# Patient Record
Sex: Male | Born: 1980 | Race: White | Hispanic: No | Smoking: Current every day smoker
Health system: Southern US, Community
[De-identification: ages and names within clinical notes are randomized; demographics above are authoritative.]

## PROBLEM LIST (undated history)

## (undated) DIAGNOSIS — R51 Headache: Secondary | ICD-10-CM

## (undated) DIAGNOSIS — R519 Headache, unspecified: Secondary | ICD-10-CM

---

## 2002-03-31 ENCOUNTER — Emergency Department (HOSPITAL_COMMUNITY): Admission: EM | Admit: 2002-03-31 | Discharge: 2002-03-31 | Payer: Self-pay | Admitting: Internal Medicine

## 2002-06-04 ENCOUNTER — Emergency Department (HOSPITAL_COMMUNITY): Admission: EM | Admit: 2002-06-04 | Discharge: 2002-06-04 | Payer: Self-pay | Admitting: *Deleted

## 2002-07-26 ENCOUNTER — Emergency Department (HOSPITAL_COMMUNITY): Admission: EM | Admit: 2002-07-26 | Discharge: 2002-07-26 | Payer: Self-pay | Admitting: Emergency Medicine

## 2002-12-08 ENCOUNTER — Emergency Department (HOSPITAL_COMMUNITY): Admission: EM | Admit: 2002-12-08 | Discharge: 2002-12-08 | Payer: Self-pay | Admitting: Emergency Medicine

## 2003-04-14 ENCOUNTER — Emergency Department (HOSPITAL_COMMUNITY): Admission: EM | Admit: 2003-04-14 | Discharge: 2003-04-14 | Payer: Self-pay | Admitting: Emergency Medicine

## 2003-05-06 ENCOUNTER — Encounter: Payer: Self-pay | Admitting: Family Medicine

## 2003-05-06 ENCOUNTER — Ambulatory Visit (HOSPITAL_COMMUNITY): Admission: RE | Admit: 2003-05-06 | Discharge: 2003-05-06 | Payer: Self-pay | Admitting: Family Medicine

## 2003-08-08 ENCOUNTER — Emergency Department (HOSPITAL_COMMUNITY): Admission: EM | Admit: 2003-08-08 | Discharge: 2003-08-08 | Payer: Self-pay | Admitting: Emergency Medicine

## 2003-10-27 ENCOUNTER — Emergency Department (HOSPITAL_COMMUNITY): Admission: EM | Admit: 2003-10-27 | Discharge: 2003-10-27 | Payer: Self-pay | Admitting: Emergency Medicine

## 2003-12-22 ENCOUNTER — Emergency Department (HOSPITAL_COMMUNITY): Admission: EM | Admit: 2003-12-22 | Discharge: 2003-12-22 | Payer: Self-pay | Admitting: Emergency Medicine

## 2004-01-16 ENCOUNTER — Emergency Department (HOSPITAL_COMMUNITY): Admission: EM | Admit: 2004-01-16 | Discharge: 2004-01-16 | Payer: Self-pay | Admitting: Emergency Medicine

## 2004-03-01 ENCOUNTER — Emergency Department (HOSPITAL_COMMUNITY): Admission: EM | Admit: 2004-03-01 | Discharge: 2004-03-01 | Payer: Self-pay | Admitting: Emergency Medicine

## 2004-06-01 ENCOUNTER — Emergency Department (HOSPITAL_COMMUNITY): Admission: EM | Admit: 2004-06-01 | Discharge: 2004-06-01 | Payer: Self-pay | Admitting: Emergency Medicine

## 2004-11-16 ENCOUNTER — Emergency Department (HOSPITAL_COMMUNITY): Admission: EM | Admit: 2004-11-16 | Discharge: 2004-11-16 | Payer: Self-pay | Admitting: Emergency Medicine

## 2004-12-08 ENCOUNTER — Emergency Department (HOSPITAL_COMMUNITY): Admission: EM | Admit: 2004-12-08 | Discharge: 2004-12-08 | Payer: Self-pay | Admitting: Emergency Medicine

## 2006-02-07 ENCOUNTER — Emergency Department (HOSPITAL_COMMUNITY): Admission: EM | Admit: 2006-02-07 | Discharge: 2006-02-07 | Payer: Self-pay | Admitting: Emergency Medicine

## 2006-02-11 ENCOUNTER — Emergency Department (HOSPITAL_COMMUNITY): Admission: EM | Admit: 2006-02-11 | Discharge: 2006-02-11 | Payer: Self-pay | Admitting: Emergency Medicine

## 2006-06-06 ENCOUNTER — Emergency Department (HOSPITAL_COMMUNITY): Admission: EM | Admit: 2006-06-06 | Discharge: 2006-06-06 | Payer: Self-pay | Admitting: *Deleted

## 2006-06-20 ENCOUNTER — Emergency Department (HOSPITAL_COMMUNITY): Admission: EM | Admit: 2006-06-20 | Discharge: 2006-06-20 | Payer: Self-pay | Admitting: Emergency Medicine

## 2006-07-16 ENCOUNTER — Emergency Department (HOSPITAL_COMMUNITY): Admission: EM | Admit: 2006-07-16 | Discharge: 2006-07-16 | Payer: Self-pay | Admitting: Emergency Medicine

## 2006-08-09 ENCOUNTER — Emergency Department (HOSPITAL_COMMUNITY): Admission: EM | Admit: 2006-08-09 | Discharge: 2006-08-09 | Payer: Self-pay | Admitting: Emergency Medicine

## 2007-11-19 ENCOUNTER — Emergency Department (HOSPITAL_COMMUNITY): Admission: EM | Admit: 2007-11-19 | Discharge: 2007-11-19 | Payer: Self-pay | Admitting: Emergency Medicine

## 2007-12-16 ENCOUNTER — Emergency Department (HOSPITAL_COMMUNITY): Admission: EM | Admit: 2007-12-16 | Discharge: 2007-12-16 | Payer: Self-pay | Admitting: Emergency Medicine

## 2008-03-24 ENCOUNTER — Emergency Department (HOSPITAL_COMMUNITY): Admission: EM | Admit: 2008-03-24 | Discharge: 2008-03-24 | Payer: Self-pay | Admitting: Emergency Medicine

## 2008-04-22 ENCOUNTER — Emergency Department (HOSPITAL_COMMUNITY): Admission: EM | Admit: 2008-04-22 | Discharge: 2008-04-22 | Payer: Self-pay | Admitting: Emergency Medicine

## 2008-05-24 ENCOUNTER — Emergency Department (HOSPITAL_COMMUNITY): Admission: EM | Admit: 2008-05-24 | Discharge: 2008-05-24 | Payer: Self-pay | Admitting: Emergency Medicine

## 2008-06-01 ENCOUNTER — Emergency Department (HOSPITAL_COMMUNITY): Admission: EM | Admit: 2008-06-01 | Discharge: 2008-06-01 | Payer: Self-pay | Admitting: Emergency Medicine

## 2008-08-05 ENCOUNTER — Emergency Department (HOSPITAL_COMMUNITY): Admission: EM | Admit: 2008-08-05 | Discharge: 2008-08-05 | Payer: Self-pay | Admitting: Emergency Medicine

## 2008-08-28 ENCOUNTER — Emergency Department (HOSPITAL_COMMUNITY): Admission: EM | Admit: 2008-08-28 | Discharge: 2008-08-29 | Payer: Self-pay | Admitting: *Deleted

## 2008-09-19 ENCOUNTER — Emergency Department (HOSPITAL_COMMUNITY): Admission: EM | Admit: 2008-09-19 | Discharge: 2008-09-19 | Payer: Self-pay | Admitting: Emergency Medicine

## 2009-04-28 ENCOUNTER — Emergency Department (HOSPITAL_COMMUNITY): Admission: EM | Admit: 2009-04-28 | Discharge: 2009-04-28 | Payer: Self-pay | Admitting: Emergency Medicine

## 2009-06-08 ENCOUNTER — Emergency Department (HOSPITAL_COMMUNITY): Admission: EM | Admit: 2009-06-08 | Discharge: 2009-06-08 | Payer: Self-pay | Admitting: Emergency Medicine

## 2009-06-22 ENCOUNTER — Emergency Department (HOSPITAL_COMMUNITY): Admission: EM | Admit: 2009-06-22 | Discharge: 2009-06-22 | Payer: Self-pay | Admitting: Emergency Medicine

## 2009-08-23 ENCOUNTER — Emergency Department (HOSPITAL_COMMUNITY): Admission: EM | Admit: 2009-08-23 | Discharge: 2009-08-23 | Payer: Self-pay | Admitting: Emergency Medicine

## 2009-08-24 ENCOUNTER — Emergency Department (HOSPITAL_COMMUNITY): Admission: EM | Admit: 2009-08-24 | Discharge: 2009-08-24 | Payer: Self-pay | Admitting: Emergency Medicine

## 2009-10-21 ENCOUNTER — Emergency Department (HOSPITAL_COMMUNITY): Admission: EM | Admit: 2009-10-21 | Discharge: 2009-10-21 | Payer: Self-pay | Admitting: Emergency Medicine

## 2009-10-22 ENCOUNTER — Emergency Department (HOSPITAL_COMMUNITY): Admission: EM | Admit: 2009-10-22 | Discharge: 2009-10-22 | Payer: Self-pay | Admitting: Emergency Medicine

## 2009-10-25 ENCOUNTER — Emergency Department (HOSPITAL_COMMUNITY): Admission: EM | Admit: 2009-10-25 | Discharge: 2009-10-25 | Payer: Self-pay | Admitting: Emergency Medicine

## 2010-01-01 ENCOUNTER — Emergency Department (HOSPITAL_COMMUNITY): Admission: EM | Admit: 2010-01-01 | Discharge: 2010-01-01 | Payer: Self-pay | Admitting: Emergency Medicine

## 2010-05-09 ENCOUNTER — Emergency Department (HOSPITAL_COMMUNITY): Admission: EM | Admit: 2010-05-09 | Discharge: 2010-05-09 | Payer: Self-pay | Admitting: Emergency Medicine

## 2010-07-24 IMAGING — CT CT ABD-PELV W/O CM
3 of 4 series · 9 of 46 positions shown, 16 images · non-contrast
Comparison: Abdominal ultrasound 06/08/2009.

CLINICAL DATA: Left flank pain.  Question ureteral calculus.

CT ABDOMEN AND PELVIS WITHOUT CONTRAST
TECHNIQUE: Multidetector CT imaging of the abdomen and pelvis was
performed following the standard protocol without intravenous
contrast.

[Series 3: lung 5.0 b60f · axial · 0.66mm/px · z∈[-168,-82]mm · 5 of 27 slices shown, 10 images]
[im 5/27  soft-tissue]
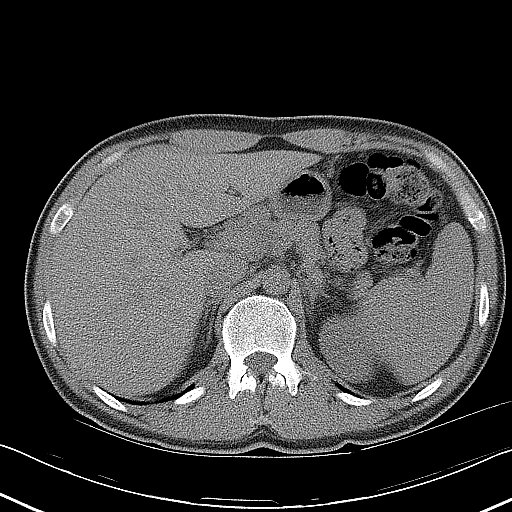
[im 5/27  bone]
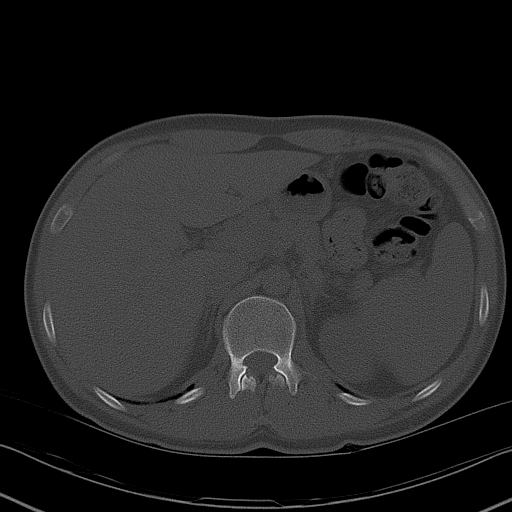
[im 9/27  soft-tissue]
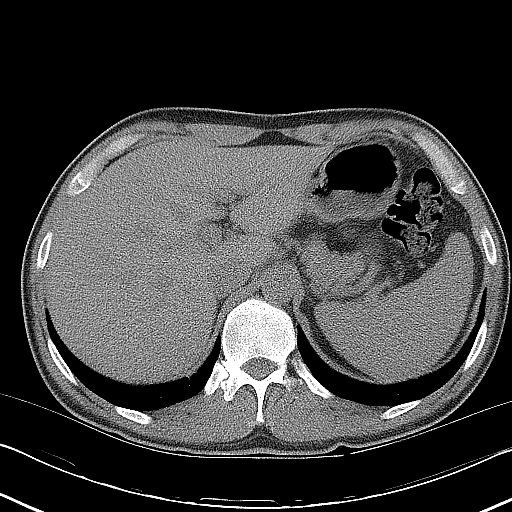
[im 9/27  lung]
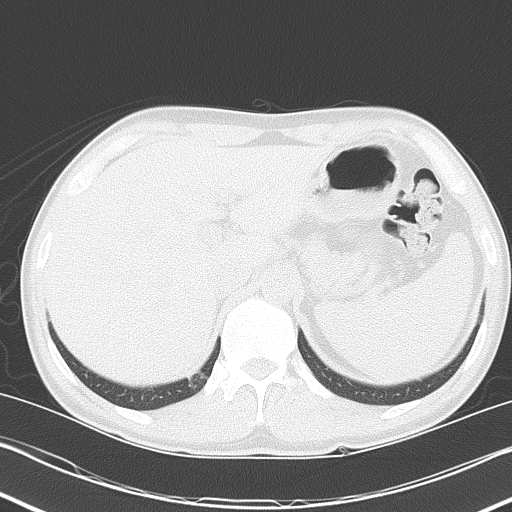
[im 14/27  soft-tissue]
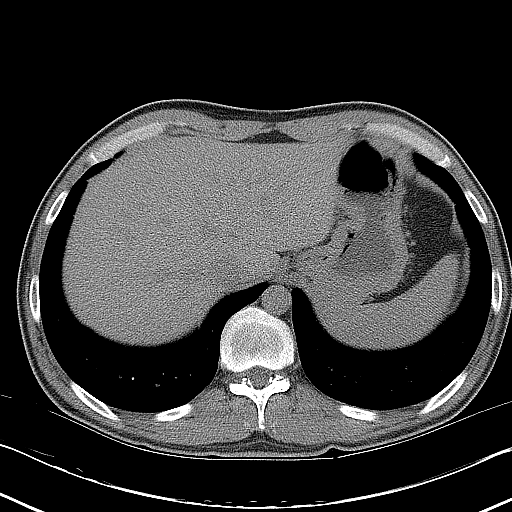
[im 14/27  lung]
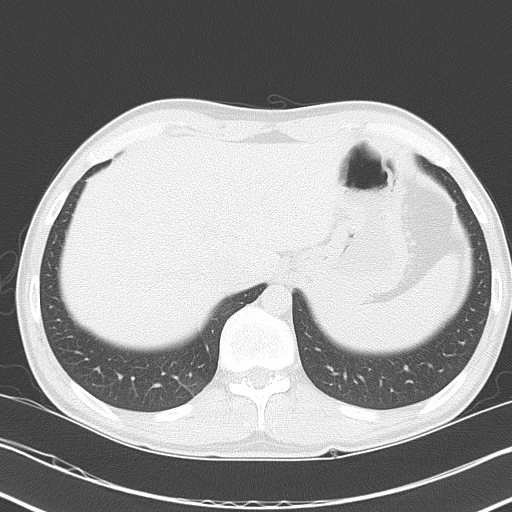
[im 18/27  soft-tissue]
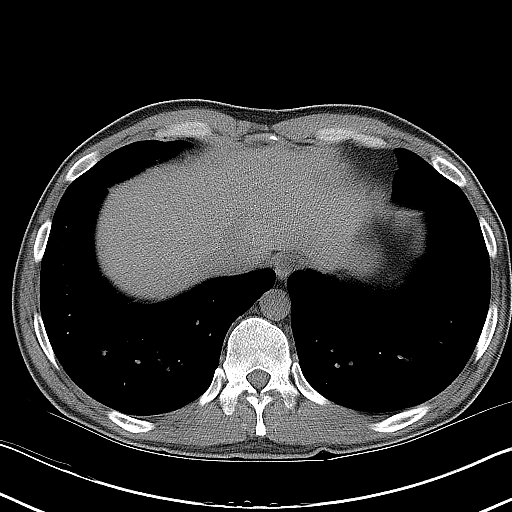
[im 18/27  lung]
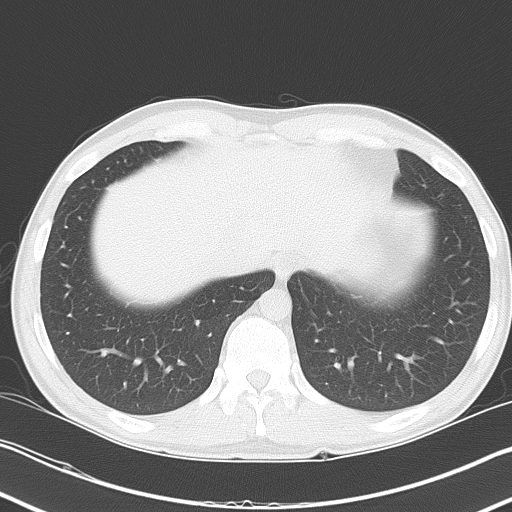
[im 22/27  soft-tissue]
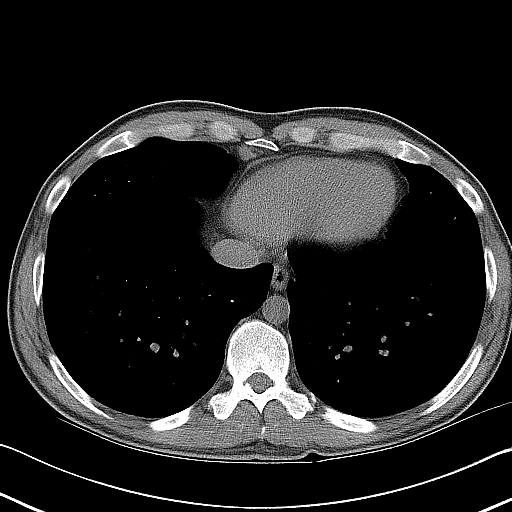
[im 22/27  lung]
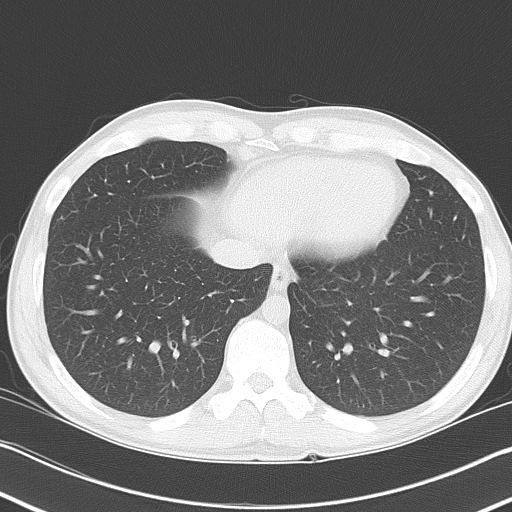

[Series 4: mpr coronal (id) · coronal · 0.73mm/px · 3 of 74 slices shown, 4 images]
[im 25/74  soft-tissue]
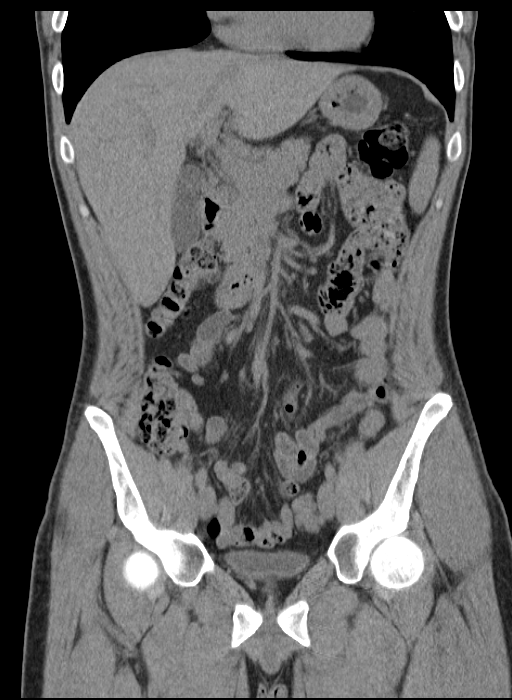
[im 33/74  soft-tissue]
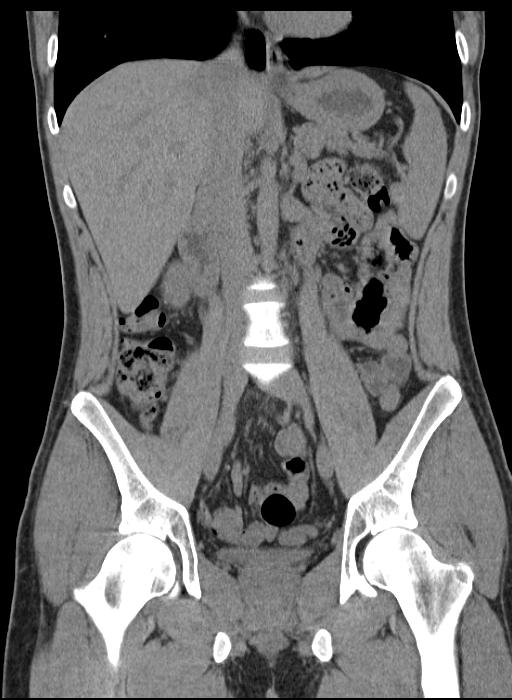
[im 33/74  bone]
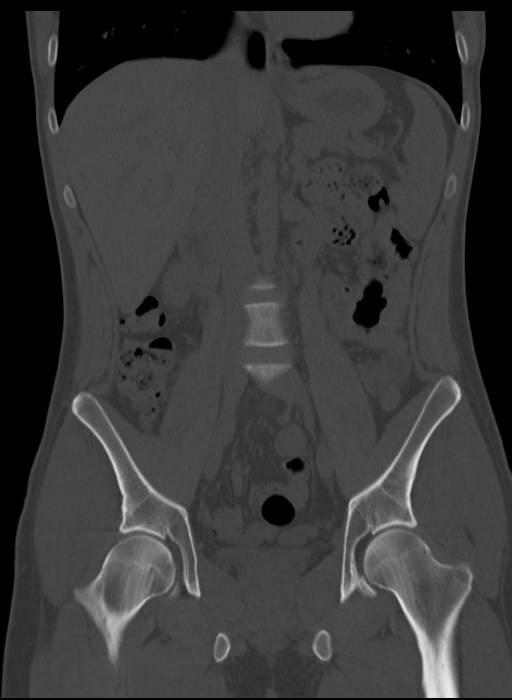
[im 41/74  soft-tissue]
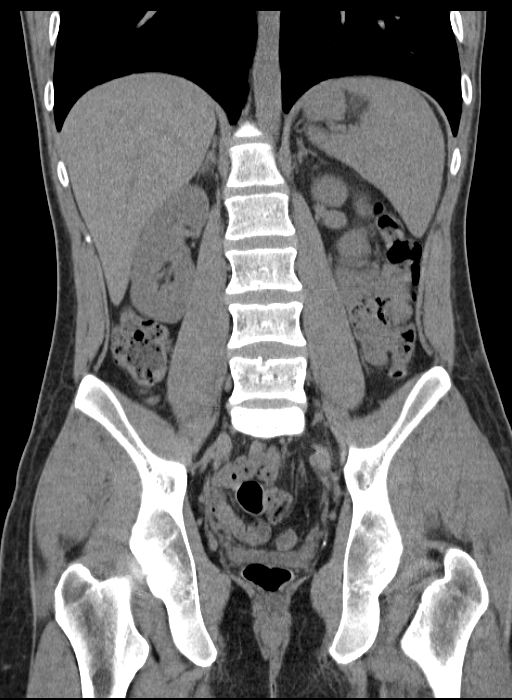

[Series 5: mpr sagittal (id) · sagittal · 0.48mm/px · 1 of 116 slices shown, 2 images]
[im 39/116  soft-tissue]
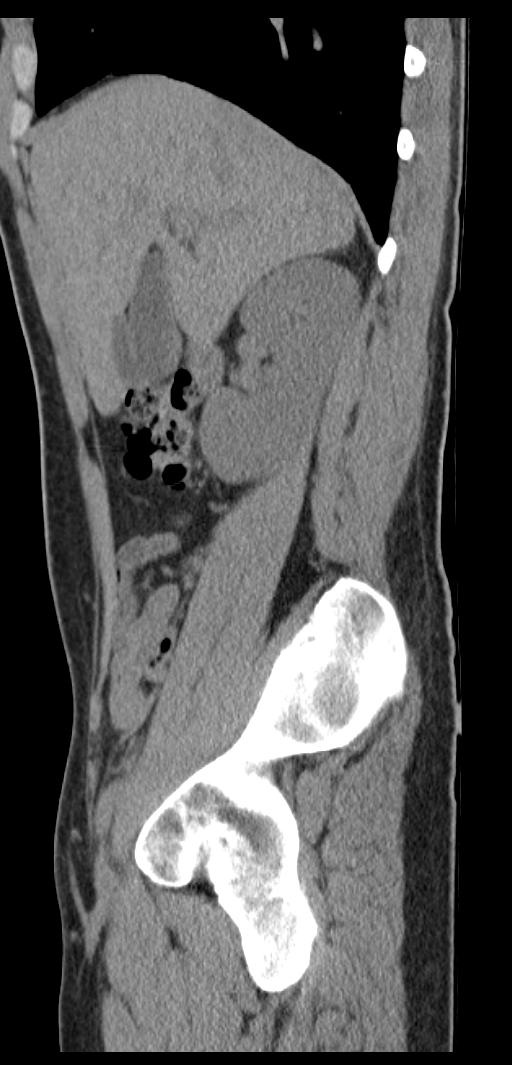
[im 39/116  bone]
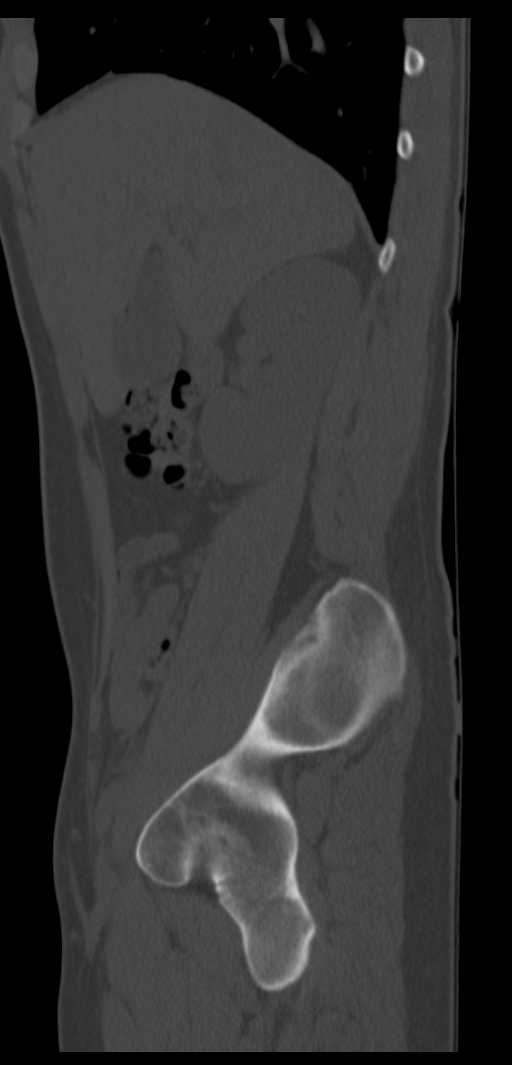

[9 of 46 positions shown; findings below may reference images not displayed]

FINDINGS: There is no evidence of renal or ureteral calculus.
There is no hydronephrosis or perinephric soft tissue stranding.
No focal renal abnormalities are identified.  There are small
pelvic phleboliths bilaterally.

The lung bases are clear.  There is no pleural effusion.  As imaged
in the noncontrast state, the liver, spleen, gallbladder, pancreas
and adrenal glands appear normal.  The bowel gas pattern is normal.
The appendix appears normal.  No inflammatory changes are
identified.  There is mild disc space loss at L5-S1.
IMPRESSION: No evidence of urinary tract calculus or hydronephrosis.  No acute
abdominal pelvic findings.

## 2011-02-28 LAB — POCT I-STAT, CHEM 8
BUN: 15 mg/dL (ref 6–23)
Calcium, Ion: 1.22 mmol/L (ref 1.12–1.32)
Chloride: 102 mEq/L (ref 96–112)
Creatinine, Ser: 0.8 mg/dL (ref 0.4–1.5)
Glucose, Bld: 95 mg/dL (ref 70–99)
HCT: 46 % (ref 39.0–52.0)
Hemoglobin: 15.6 g/dL (ref 13.0–17.0)
Potassium: 3.8 mEq/L (ref 3.5–5.1)
Sodium: 138 mEq/L (ref 135–145)
TCO2: 30 mmol/L (ref 0–100)

## 2011-02-28 LAB — URINALYSIS, ROUTINE W REFLEX MICROSCOPIC
Bilirubin Urine: NEGATIVE
Glucose, UA: NEGATIVE mg/dL
Hgb urine dipstick: NEGATIVE
Ketones, ur: NEGATIVE mg/dL
Nitrite: NEGATIVE
Protein, ur: NEGATIVE mg/dL
Specific Gravity, Urine: 1.02 (ref 1.005–1.030)
Urobilinogen, UA: 1 mg/dL (ref 0.0–1.0)
pH: 7.5 (ref 5.0–8.0)

## 2011-03-16 LAB — BASIC METABOLIC PANEL
BUN: 7 mg/dL (ref 6–23)
CO2: 30 mEq/L (ref 19–32)
Calcium: 9.6 mg/dL (ref 8.4–10.5)
Chloride: 101 mEq/L (ref 96–112)
Creatinine, Ser: 0.79 mg/dL (ref 0.4–1.5)
GFR calc Af Amer: >60 mL/min (ref >60)
GFR calc non Af Amer: >60 mL/min (ref >60)
Glucose, Bld: 90 mg/dL (ref 70–99)
Potassium: 3.8 mEq/L (ref 3.5–5.1)
Sodium: 137 mEq/L (ref 135–145)

## 2011-03-16 LAB — CBC
HCT: 44.1 % (ref 39.0–52.0)
Hemoglobin: 15.2 g/dL (ref 13.0–17.0)
MCHC: 34.5 g/dL (ref 30.0–36.0)
MCV: 94.4 fL (ref 78.0–100.0)
Platelets: 225 10*3/uL (ref 150–400)
RBC: 4.67 MIL/uL (ref 4.22–5.81)
RDW: 13 % (ref 11.5–15.5)
WBC: 9.8 10*3/uL (ref 4.0–10.5)

## 2011-03-16 LAB — DIFFERENTIAL
Basophils Absolute: 0.1 10*3/uL (ref 0.0–0.1)
Basophils Relative: 1 % (ref 0–1)
Eosinophils Absolute: 0.5 10*3/uL (ref 0.0–0.7)
Eosinophils Relative: 6 % — ABNORMAL HIGH (ref 0–5)
Lymphocytes Relative: 21 % (ref 12–46)
Lymphs Abs: 2 10*3/uL (ref 0.7–4.0)
Monocytes Absolute: 0.7 10*3/uL (ref 0.1–1.0)
Monocytes Relative: 7 % (ref 3–12)
Neutro Abs: 6.5 10*3/uL (ref 1.7–7.7)
Neutrophils Relative %: 67 % (ref 43–77)

## 2011-03-21 LAB — DIFFERENTIAL
Basophils Absolute: 0 10*3/uL (ref 0.0–0.1)
Basophils Relative: 0 % (ref 0–1)
Eosinophils Absolute: 0.4 10*3/uL (ref 0.0–0.7)
Eosinophils Relative: 6 % — ABNORMAL HIGH (ref 0–5)
Lymphocytes Relative: 28 % (ref 12–46)
Lymphs Abs: 1.8 10*3/uL (ref 0.7–4.0)
Monocytes Absolute: 0.5 10*3/uL (ref 0.1–1.0)
Monocytes Relative: 8 % (ref 3–12)
Neutro Abs: 3.6 10*3/uL (ref 1.7–7.7)
Neutrophils Relative %: 57 % (ref 43–77)

## 2011-03-21 LAB — CBC
HCT: 40 % (ref 39.0–52.0)
Hemoglobin: 14.1 g/dL (ref 13.0–17.0)
MCHC: 35.2 g/dL (ref 30.0–36.0)
MCV: 93.4 fL (ref 78.0–100.0)
Platelets: 194 10*3/uL (ref 150–400)
RBC: 4.29 MIL/uL (ref 4.22–5.81)
RDW: 12.9 % (ref 11.5–15.5)
WBC: 6.3 10*3/uL (ref 4.0–10.5)

## 2011-03-21 LAB — COMPREHENSIVE METABOLIC PANEL
ALT: 32 U/L (ref 0–53)
AST: 22 U/L (ref 0–37)
Albumin: 3.6 g/dL (ref 3.5–5.2)
Alkaline Phosphatase: 48 U/L (ref 39–117)
BUN: 10 mg/dL (ref 6–23)
CO2: 32 mEq/L (ref 19–32)
Calcium: 8.9 mg/dL (ref 8.4–10.5)
Chloride: 102 mEq/L (ref 96–112)
Creatinine, Ser: 0.89 mg/dL (ref 0.4–1.5)
GFR calc Af Amer: >60 mL/min (ref >60)
GFR calc non Af Amer: >60 mL/min (ref >60)
Glucose, Bld: 96 mg/dL (ref 70–99)
Potassium: 3.5 mEq/L (ref 3.5–5.1)
Sodium: 138 mEq/L (ref 135–145)
Total Bilirubin: 0.7 mg/dL (ref 0.3–1.2)
Total Protein: 6.6 g/dL (ref 6.0–8.3)

## 2011-03-21 LAB — URINALYSIS, ROUTINE W REFLEX MICROSCOPIC
Bilirubin Urine: NEGATIVE
Glucose, UA: NEGATIVE mg/dL
Ketones, ur: NEGATIVE mg/dL
Leukocytes, UA: NEGATIVE
Nitrite: NEGATIVE
Protein, ur: NEGATIVE mg/dL
Specific Gravity, Urine: 1.01 (ref 1.005–1.030)
Urobilinogen, UA: 1 mg/dL (ref 0.0–1.0)
pH: 7 (ref 5.0–8.0)

## 2011-03-21 LAB — LIPASE, BLOOD: Lipase: 14 U/L (ref 11–59)

## 2011-03-21 LAB — URINE MICROSCOPIC-ADD ON

## 2011-09-12 LAB — RAPID URINE DRUG SCREEN, HOSP PERFORMED
Amphetamines: NOT DETECTED
Barbiturates: NOT DETECTED
Benzodiazepines: NOT DETECTED
Cocaine: POSITIVE — AB
Opiates: NOT DETECTED
Tetrahydrocannabinol: NOT DETECTED

## 2011-09-12 LAB — CBC
HCT: 44.5
Hemoglobin: 15.1
MCHC: 33.8
MCV: 92.7
Platelets: 246
RBC: 4.8
RDW: 13.1
WBC: 9

## 2011-09-12 LAB — BASIC METABOLIC PANEL
BUN: 12
CO2: 30
Calcium: 9.6
Chloride: 100
Creatinine, Ser: 0.84
GFR calc Af Amer: >60
GFR calc non Af Amer: >60
Glucose, Bld: 81
Potassium: 4.3
Sodium: 135

## 2011-09-12 LAB — DIFFERENTIAL
Basophils Absolute: 0
Basophils Relative: 0
Eosinophils Absolute: 0.5
Eosinophils Relative: 6 — ABNORMAL HIGH
Lymphocytes Relative: 26
Lymphs Abs: 2.4
Monocytes Absolute: 0.6
Monocytes Relative: 7
Neutro Abs: 5.4
Neutrophils Relative %: 61

## 2011-09-12 LAB — ETHANOL: Alcohol, Ethyl (B): <5

## 2016-11-24 ENCOUNTER — Emergency Department (HOSPITAL_COMMUNITY): Payer: Self-pay

## 2016-11-24 ENCOUNTER — Encounter (HOSPITAL_COMMUNITY): Payer: Self-pay

## 2016-11-24 ENCOUNTER — Emergency Department (HOSPITAL_COMMUNITY)
Admission: EM | Admit: 2016-11-24 | Discharge: 2016-11-24 | Disposition: A | Discharge status: Home / Self Care | Payer: Self-pay | Attending: Emergency Medicine | Admitting: Emergency Medicine

## 2016-11-24 DIAGNOSIS — Y929 Unspecified place or not applicable: Secondary | ICD-10-CM | POA: Insufficient documentation

## 2016-11-24 DIAGNOSIS — S61432A Puncture wound without foreign body of left hand, initial encounter: Secondary | ICD-10-CM | POA: Insufficient documentation

## 2016-11-24 DIAGNOSIS — M7989 Other specified soft tissue disorders: Secondary | ICD-10-CM

## 2016-11-24 DIAGNOSIS — T148XXA Other injury of unspecified body region, initial encounter: Secondary | ICD-10-CM

## 2016-11-24 DIAGNOSIS — Y9389 Activity, other specified: Secondary | ICD-10-CM | POA: Insufficient documentation

## 2016-11-24 DIAGNOSIS — R591 Generalized enlarged lymph nodes: Secondary | ICD-10-CM

## 2016-11-24 DIAGNOSIS — M79601 Pain in right arm: Secondary | ICD-10-CM

## 2016-11-24 DIAGNOSIS — Y999 Unspecified external cause status: Secondary | ICD-10-CM | POA: Insufficient documentation

## 2016-11-24 DIAGNOSIS — F17200 Nicotine dependence, unspecified, uncomplicated: Secondary | ICD-10-CM | POA: Insufficient documentation

## 2016-11-24 DIAGNOSIS — S61431A Puncture wound without foreign body of right hand, initial encounter: Secondary | ICD-10-CM | POA: Insufficient documentation

## 2016-11-24 DIAGNOSIS — W5501XA Bitten by cat, initial encounter: Secondary | ICD-10-CM | POA: Insufficient documentation

## 2016-11-24 HISTORY — DX: Headache, unspecified: R51.9

## 2016-11-24 HISTORY — DX: Headache: R51

## 2016-11-24 LAB — BASIC METABOLIC PANEL
Anion gap: 5 (ref 5–15)
BUN: 11 mg/dL (ref 6–20)
CALCIUM: 9.4 mg/dL (ref 8.9–10.3)
CO2: 29 mmol/L (ref 22–32)
Chloride: 100 mmol/L — ABNORMAL LOW (ref 101–111)
Creatinine, Ser: 0.79 mg/dL (ref 0.61–1.24)
GFR calc Af Amer: >60 mL/min (ref >60)
GFR calc non Af Amer: >60 mL/min (ref >60)
Glucose, Bld: 107 mg/dL — ABNORMAL HIGH (ref 65–99)
Potassium: 4.4 mmol/L (ref 3.5–5.1)
Sodium: 134 mmol/L — ABNORMAL LOW (ref 135–145)

## 2016-11-24 LAB — CBC WITH DIFFERENTIAL/PLATELET
BASOS ABS: 0 10*3/uL (ref 0.0–0.1)
BASOS PCT: 0 %
Eosinophils Absolute: 0.4 10*3/uL (ref 0.0–0.7)
Eosinophils Relative: 5 %
HEMATOCRIT: 44 % (ref 39.0–52.0)
HEMOGLOBIN: 14.6 g/dL (ref 13.0–17.0)
Lymphocytes Relative: 31 %
Lymphs Abs: 2.7 10*3/uL (ref 0.7–4.0)
MCH: 31.2 pg (ref 26.0–34.0)
MCHC: 33.2 g/dL (ref 30.0–36.0)
MCV: 94 fL (ref 78.0–100.0)
MONOS PCT: 9 %
Monocytes Absolute: 0.8 10*3/uL (ref 0.1–1.0)
NEUTROS ABS: 4.8 10*3/uL (ref 1.7–7.7)
Neutrophils Relative %: 55 %
Platelets: 189 10*3/uL (ref 150–400)
RBC: 4.68 MIL/uL (ref 4.22–5.81)
RDW: 13 % (ref 11.5–15.5)
WBC: 8.7 10*3/uL (ref 4.0–10.5)

## 2016-11-24 MED ORDER — AMOXICILLIN-POT CLAVULANATE 875-125 MG PO TABS: 1.0000 | ORAL_TABLET | Freq: Two times a day (BID) | ORAL | 0 refills | Status: DC

## 2016-11-24 MED ORDER — IBUPROFEN 800 MG PO TABS: 800.0000 mg | ORAL_TABLET | Freq: Three times a day (TID) | ORAL | 0 refills | Status: DC

## 2016-11-24 MED ORDER — SODIUM CHLORIDE 0.9 % IV SOLN
3.0000 g | Freq: Once | INTRAVENOUS | Status: AC
  Administered 2016-11-24: 3 g via INTRAVENOUS
  Filled 2016-11-24: qty 3

## 2016-11-24 MED ORDER — IBUPROFEN 800 MG PO TABS
800.0000 mg | ORAL_TABLET | Freq: Once | ORAL | Status: AC
  Administered 2016-11-24: 800 mg via ORAL
  Filled 2016-11-24: qty 1

## 2016-11-24 NOTE — Discharge Instructions (Signed)
Take the antibiotic as directed until its finished.  Return ere in 1-2 days for any worsening symptoms such as fever. Increasing pain, redness to your arm or red streaks.

## 2016-11-24 NOTE — ED Triage Notes (Signed)
Pt reports pain and swelling to left upper arm and axilla.

## 2016-11-26 NOTE — ED Provider Notes (Signed)
[facility]PT Provider Note   CSN: [id number] Arrival date & time: 11/24/16  1012     History   Chief Complaint Chief Complaint  Patient presents with  . axillary pain    HPI Bradley Kirk is a 35 y.o. male.  HPI  Bradley SamHarold J Kirk is a 35 y.o. male who presents to the Emergency Department complaining of painful nodule to the right forearm and axilla.  He reports noticing a small "knot" initially but has been increasing in size and pain for nearly one week.  Pain is associated with palpation.  He denies fever, chills, redness chest pain or recent illness.  He has multiple small scratches and punctures to both hands.  States he has a new kitten that he has been playing with.  Cat is up to date on vaccines.  He denies redness or pain to his hands.   Past Medical History:  Diagnosis Date  . Headache     There are no active problems to display for this patient.   History reviewed. No pertinent surgical history.     Home Medications    Prior to Admission medications   Medication Sig Start Date End Date Taking? Authorizing Provider  amoxicillin-clavulanate (AUGMENTIN) 875-125 MG tablet Take 1 tablet by mouth 2 (two) times daily. 11/24/16   Pailynn Vahey, PA-C  ibuprofen (ADVIL,MOTRIN) 800 MG tablet Take 1 tablet (800 mg total) by mouth 3 (three) times daily. 11/24/16   Nathan Moctezuma, PA-C    Family History No family history on file.  Social History Social History  Substance Use Topics  . Smoking status: Current Every Day Smoker  . Smokeless tobacco: Never Used  . Alcohol use No     Allergies   Patient has no known allergies.   Review of Systems Review of Systems  Constitutional: Negative for activity change, appetite change, chills and fever.  Respiratory: Negative for cough and shortness of breath.   Cardiovascular: Negative for chest pain.  Gastrointestinal: Negative for nausea and vomiting.  Musculoskeletal: Negative for arthralgias, joint  swelling and neck pain.  Skin: Positive for wound. Negative for color change and rash.  Neurological: Negative for dizziness, weakness and numbness.  Hematological: Positive for adenopathy.     Physical Exam Updated Vital Signs BP 110/65   Pulse 64   Temp 98.9 F (37.2 C) (Oral)   Resp 18   Ht 6\' 6"  (1.981 m)   Wt 97.5 kg   SpO2 100%   BMI 24.85 kg/m   Physical Exam  Constitutional: He appears well-developed and well-nourished. No distress.  HENT:  Head: Atraumatic.  Mouth/Throat: Oropharynx is clear and moist.  Neck: Normal range of motion. Neck supple.  Cardiovascular: Normal rate, regular rhythm and intact distal pulses.   Pulmonary/Chest: Effort normal.  Musculoskeletal: Normal range of motion.  Lymphadenopathy:    He has no cervical adenopathy.  Single large, firm mobile mass to the right upper arm and smaller, firm mobile mass to right axilla. No erythma  Neurological: He is alert.  Skin: Skin is warm. No rash noted. No erythema.  Multiple, small scratches and puncture wounds to bilateral dorsal hands.  No drainage, erythema or red streaks.   Nursing note and vitals reviewed.    ED Treatments / Results  Labs (all labs ordered are listed, but only abnormal results are displayed) Labs Reviewed  BASIC METABOLIC PANEL - Abnormal; Notable for the following:       Result Value   Sodium 134 (*)  Chloride 100 (*)    Glucose, Bld 107 (*)    All other components within normal limits  CBC WITH DIFFERENTIAL/PLATELET    EKG  EKG Interpretation None       Radiology [place] Venous Img Upper Uni Right  Result Date: 11/24/2016 CLINICAL DATA:  Right upper extremity pain and swelling for 2 weeks, recent cat scratch EXAM: Right UPPER EXTREMITY VENOUS DOPPLER ULTRASOUND TECHNIQUE: Gray-scale sonography with graded compression, as well as color Doppler and duplex ultrasound were performed to evaluate the upper extremity deep venous system from the level of the subclavian  vein and including the jugular, axillary, basilic, radial, ulnar and upper cephalic vein. Spectral Doppler was utilized to evaluate flow at rest and with distal augmentation maneuvers. COMPARISON:  None. FINDINGS: Contralateral Subclavian Vein: Respiratory phasicity is normal and symmetric with the symptomatic side. No evidence of thrombus. Normal compressibility. Internal Jugular Vein: No evidence of thrombus. Normal compressibility, respiratory phasicity and response to augmentation. Subclavian Vein: No evidence of thrombus. Normal compressibility, respiratory phasicity and response to augmentation. Axillary Vein: No evidence of thrombus. Normal compressibility, respiratory phasicity and response to augmentation. Cephalic Vein: No evidence of thrombus. Normal compressibility, respiratory phasicity and response to augmentation. Basilic Vein: No evidence of thrombus. Normal compressibility, respiratory phasicity and response to augmentation. Brachial Veins: No evidence of thrombus. Normal compressibility, respiratory phasicity and response to augmentation. Radial Veins: No evidence of thrombus. Normal compressibility, respiratory phasicity and response to augmentation. Ulnar Veins: No evidence of thrombus. Normal compressibility, respiratory phasicity and response to augmentation. Venous Reflux:  None visualized. Other Findings: In the upper right all arm correspond to palpable abnormalities bar to lymph nodes which measure 1.9 and 1.4 cm in greatest dimension respectively. These are likely reactive in nature. IMPRESSION: No evidence of deep venous thrombosis. Likely reactive lymph nodes in the upper right arm. Electronically Signed   By: Alcide CleverMark  Lukens M.D.   On: 11/24/2016 13:23     Procedures Procedures (including critical care time)  Medications Ordered in ED Medications  ibuprofen (ADVIL,MOTRIN) tablet 800 mg (800 mg Oral Given 11/24/16 1248)  Ampicillin-Sulbactam (UNASYN) 3 g in sodium chloride 0.9 %  100 mL IVPB (0 g Intravenous Stopped 11/24/16 1515)     Initial Impression / Assessment and Plan / ED Course  I have reviewed the triage vital signs and the nursing notes.  Pertinent labs & imaging results that were available during my care of the patient were reviewed by me and considered in my medical decision making (see chart for details).  Clinical Course     Pt with lymphadenopathy of the right UE likely result of recent cat bites and scratches.  Pt is otherwise well appearing.  No fever or lymphangitis.  NV and motor intact.    Labs reassuring.  IV unasyn given here.  rx for augmentin.  Pt agrees to recheck here in 2 days or sooner for any worsening sx's.  Appears stable for d/c  Final Clinical Impressions(s) / ED Diagnoses   Final diagnoses:  Pain and swelling of right upper extremity  Lymphadenopathy  Animal bites    New Prescriptions Discharge Medication List as of 11/24/2016  3:09 PM    START taking these medications   Details  amoxicillin-clavulanate (AUGMENTIN) 875-125 MG tablet Take 1 tablet by mouth 2 (two) times daily., Starting Thu 11/24/2016, Print    ibuprofen (ADVIL,MOTRIN) 800 MG tablet Take 1 tablet (800 mg total) by mouth 3 (three) times daily., Starting Thu 11/24/2016, Print  Pauline Aus, PA-C 11/26/16 2322    Marily Memos, MD 11/29/16 [phone]

## 2016-12-07 ENCOUNTER — Emergency Department (HOSPITAL_COMMUNITY)
Admission: EM | Admit: 2016-12-07 | Discharge: 2016-12-07 | Disposition: A | Discharge status: Home / Self Care | Payer: Self-pay | Attending: Emergency Medicine | Admitting: Emergency Medicine

## 2016-12-07 ENCOUNTER — Encounter (HOSPITAL_COMMUNITY): Payer: Self-pay | Admitting: Cardiology

## 2016-12-07 DIAGNOSIS — W5503XS Scratched by cat, sequela: Secondary | ICD-10-CM | POA: Insufficient documentation

## 2016-12-07 DIAGNOSIS — F17200 Nicotine dependence, unspecified, uncomplicated: Secondary | ICD-10-CM | POA: Insufficient documentation

## 2016-12-07 DIAGNOSIS — I889 Nonspecific lymphadenitis, unspecified: Secondary | ICD-10-CM | POA: Insufficient documentation

## 2016-12-07 DIAGNOSIS — S60511S Abrasion of right hand, sequela: Secondary | ICD-10-CM | POA: Insufficient documentation

## 2016-12-07 LAB — BASIC METABOLIC PANEL
ANION GAP: 7 (ref 5–15)
BUN: 15 mg/dL (ref 6–20)
CALCIUM: 8.8 mg/dL — AB (ref 8.9–10.3)
CHLORIDE: 95 mmol/L — AB (ref 101–111)
CO2: 29 mmol/L (ref 22–32)
CREATININE: 0.84 mg/dL (ref 0.61–1.24)
GFR calc Af Amer: >60 mL/min (ref >60)
GFR calc non Af Amer: >60 mL/min (ref >60)
GLUCOSE: 92 mg/dL (ref 65–99)
Potassium: 3.5 mmol/L (ref 3.5–5.1)
Sodium: 131 mmol/L — ABNORMAL LOW (ref 135–145)

## 2016-12-07 LAB — CBC WITH DIFFERENTIAL/PLATELET
BASOS PCT: 1 %
Basophils Absolute: 0 10*3/uL (ref 0.0–0.1)
Eosinophils Absolute: 0 10*3/uL (ref 0.0–0.7)
Eosinophils Relative: 1 %
HEMATOCRIT: 45.2 % (ref 39.0–52.0)
HEMOGLOBIN: 15.7 g/dL (ref 13.0–17.0)
LYMPHS PCT: 26 %
Lymphs Abs: 1.6 10*3/uL (ref 0.7–4.0)
MCH: 32.4 pg (ref 26.0–34.0)
MCHC: 34.7 g/dL (ref 30.0–36.0)
MCV: 93.2 fL (ref 78.0–100.0)
MONOS PCT: 11 %
Monocytes Absolute: 0.7 10*3/uL (ref 0.1–1.0)
NEUTROS ABS: 3.8 10*3/uL (ref 1.7–7.7)
NEUTROS PCT: 61 %
Platelets: 229 10*3/uL (ref 150–400)
RBC: 4.85 MIL/uL (ref 4.22–5.81)
RDW: 12.7 % (ref 11.5–15.5)
WBC: 6.2 10*3/uL (ref 4.0–10.5)

## 2016-12-07 MED ORDER — SODIUM CHLORIDE 0.9 % IV SOLN
3.0000 g | Freq: Once | INTRAVENOUS | Status: AC
  Administered 2016-12-07: 3 g via INTRAVENOUS
  Filled 2016-12-07: qty 3

## 2016-12-07 MED ORDER — NAPROXEN 500 MG PO TABS: 500.0000 mg | ORAL_TABLET | Freq: Two times a day (BID) | ORAL | 1 refills | Status: AC

## 2016-12-07 MED ORDER — DOXYCYCLINE HYCLATE 100 MG PO CAPS: 100.0000 mg | ORAL_CAPSULE | Freq: Two times a day (BID) | ORAL | 1 refills | Status: AC

## 2016-12-07 MED ORDER — SODIUM CHLORIDE 0.9 % IV SOLN
INTRAVENOUS | Status: DC
  Administered 2016-12-07: 18:00:00 via INTRAVENOUS

## 2016-12-07 MED ORDER — SODIUM CHLORIDE 0.9 % IV BOLUS (SEPSIS)
1000.0000 mL | Freq: Once | INTRAVENOUS | Status: AC
  Administered 2016-12-07: 1000 mL via INTRAVENOUS

## 2016-12-07 NOTE — ED Triage Notes (Signed)
Boil to right upper arm.  Seen here for same 2 weeks ago.  States he has had a fever too

## 2016-12-07 NOTE — Discharge Instructions (Signed)
Take the doxycycline antibiotic for the next 7 days. Make an appointment to follow-up with general surgery. Take Naprosyn or Motrin for the pain. Today's labs were normal. Return for any new or worse symptoms.

## 2016-12-07 NOTE — ED Provider Notes (Signed)
[facility] Provider Note   CSN: [id number] Arrival date & time: 12/07/16  1534     History   Chief Complaint Chief Complaint  Patient presents with  . Recurrent Skin Infections    HPI Bradley Kirk is a 35 y.o. male.  Patient seen December 14 here following multiple cat scratches for swelling and tenderness in the right axillary area. Patient talks about having some fevers on and off. Patient was diagnosed with a concern for cat scratch related lymphadenopathy at that time given a prescription for Augmentin. The patient unable to get that filled due to cost. So has not been on antibiotics. She now has a red area medial aspect of the right elbow that is swollen and tender and his bellies had some fevers on and off. Occasional nausea and vomiting.      Past Medical History:  Diagnosis Date  . Headache     There are no active problems to display for this patient.   History reviewed. No pertinent surgical history.     Home Medications    Prior to Admission medications   Medication Sig Start Date End Date Taking? Authorizing Provider  doxycycline (VIBRAMYCIN) 100 MG capsule Take 1 capsule (100 mg total) by mouth 2 (two) times daily. 12/07/16   Vanetta MuldersScott Alecsander Hattabaugh, MD  naproxen (NAPROSYN) 500 MG tablet Take 1 tablet (500 mg total) by mouth 2 (two) times daily. 12/07/16   Vanetta MuldersScott Dmauri Rosenow, MD    Family History History reviewed. No pertinent family history.  Social History Social History  Substance Use Topics  . Smoking status: Current Every Day Smoker  . Smokeless tobacco: Never Used  . Alcohol use No     Allergies   Patient has no known allergies.   Review of Systems Review of Systems  Constitutional: Positive for fever.  HENT: Negative for facial swelling.   Eyes: Negative for visual disturbance.  Respiratory: Negative for shortness of breath.   Cardiovascular: Negative for chest pain.  Gastrointestinal: Negative for abdominal pain.    Genitourinary: Negative for dysuria.  Musculoskeletal: Negative for back pain and neck pain.  Skin: Positive for wound. Negative for rash.  Neurological: Negative for headaches.  Hematological: Does not bruise/bleed easily.  Psychiatric/Behavioral: Negative for confusion.     Physical Exam Updated Vital Signs BP 134/78 (BP Location: Right Arm)   Pulse 79   Temp 98.4 F (36.9 C) (Oral)   Resp 16   Ht 6\' 6"  (1.981 m)   Wt 97.5 kg   SpO2 100%   BMI 24.85 kg/m   Physical Exam  Constitutional: He is oriented to person, place, and time. He appears well-developed and well-nourished. No distress.  HENT:  Head: Normocephalic and atraumatic.  Mouth/Throat: Oropharynx is clear and moist.  Eyes: Conjunctivae and EOM are normal. Pupils are equal, round, and reactive to light.  Neck: Normal range of motion. Neck supple.  Cardiovascular: Normal rate, regular rhythm and normal heart sounds.   Pulmonary/Chest: Effort normal and breath sounds normal. No respiratory distress.  Abdominal: Soft. Bowel sounds are normal.  Musculoskeletal: He exhibits tenderness.  Both hands with multiple scratches. No signs of secondary infection. Right arm just proximal to the elbow medially with an area of redness and induration measuring about 4 cm erythema and about 3 cm induration no obvious fluctuance no streaking. Also some mild tenderness and adenopathy in the right axillary area. No significant swelling. No cervical adenopathy. Radial pulse 2+ Refill distally the fingers of the right hand is  Lymphadenopathy:    He has no cervical adenopathy.  Neurological: He is alert and oriented to person, place, and time. No cranial nerve deficit or sensory deficit. He exhibits normal muscle tone. Coordination normal.  Skin: Skin is warm. Capillary refill takes less than 2 seconds. There is erythema.  Nursing note and vitals reviewed.    ED Treatments / Results  Labs (all labs ordered are listed, but only  abnormal results are displayed) Labs Reviewed  BASIC METABOLIC PANEL - Abnormal; Notable for the following:       Result Value   Sodium 131 (*)    Chloride 95 (*)    Calcium 8.8 (*)    All other components within normal limits  CBC WITH DIFFERENTIAL/PLATELET    EKG  EKG Interpretation None       Radiology No results found.  Procedures Procedures (including critical care time)  Medications Ordered in ED Medications  0.9 %  sodium chloride infusion ( Intravenous New Bag/Given 12/07/16 1739)  sodium chloride 0.9 % bolus 1,000 mL (0 mLs Intravenous Stopped 12/07/16 1835)  Ampicillin-Sulbactam (UNASYN) 3 g in sodium chloride 0.9 % 100 mL IVPB (0 g Intravenous Stopped 12/07/16 1836)     Initial Impression / Assessment and Plan / ED Course  I have reviewed the triage vital signs and the nursing notes.  Pertinent labs & imaging results that were available during my care of the patient were reviewed by me and considered in my medical decision making (see chart for details).  Clinical Course     Patient seen and treated for cat scratch lymphadenopathy on December 14. Patient discharged home with Augmentin but not able to afford to get the prescription filled. Patient still has some discomfort in the axillary area now but it has improved from before. Has an area of redness and induration consistent with an infected lymph node no direct scratch to that area on his right arm just proximal to the elbow medially. This is probably an infected lymph node. No obvious fluctuance but certainly could be pus deep in the lymph node. Patient nontoxic no acute distress. Labs without any significant abnormality. Will try patient on doxycycline and understands the importance of getting antibiotic taken for this and following up with general surgery. Doxycycline is probably not be a little cheaper than the Augmentin was. Patient will continue take anti-inflammatories recommended Naprosyn since it lasts  for 12 hours.   Final Clinical Impressions(s) / ED Diagnoses   Final diagnoses:  Cat scratch of hand, right, sequela  Lymphadenitis    New Prescriptions New Prescriptions   DOXYCYCLINE (VIBRAMYCIN) 100 MG CAPSULE    Take 1 capsule (100 mg total) by mouth 2 (two) times daily.   NAPROXEN (NAPROSYN) 500 MG TABLET    Take 1 tablet (500 mg total) by mouth 2 (two) times daily.     Vanetta MuldersScott Loren Vicens, MD 12/07/16 1907

## 2018-06-12 IMAGING — US US EXTREM  UP VENOUS*R*
1 series · 13 of 24 positions shown · non-contrast
Comparison: None.

CLINICAL DATA: Right upper extremity pain and swelling for 2 weeks,
recent cat scratch



[Series 1: us extrem up venous*right* · 0.06mm/px · 13 of 54 slices shown]
[im 1/54]
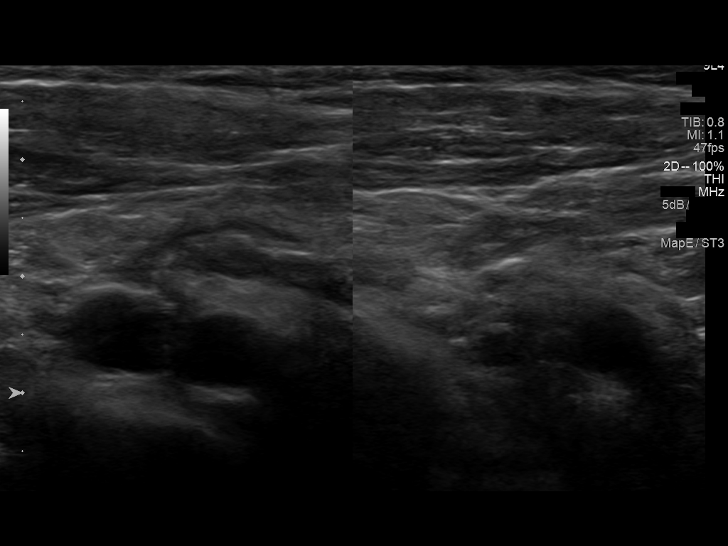
[im 5/54]
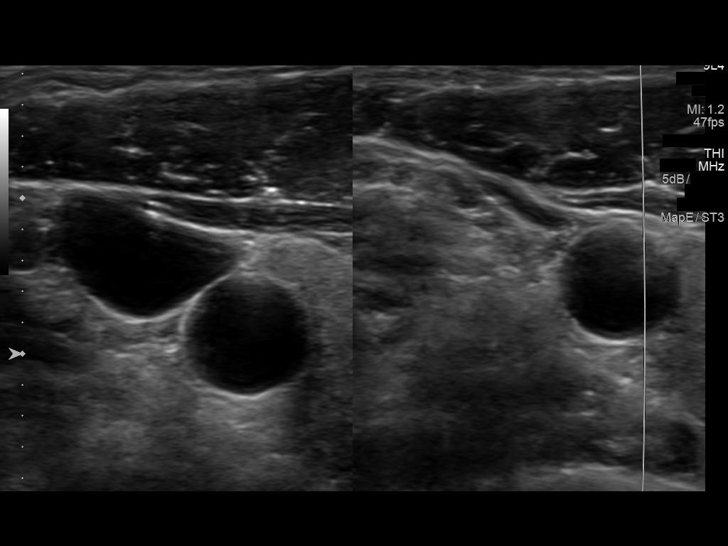
[im 10/54]
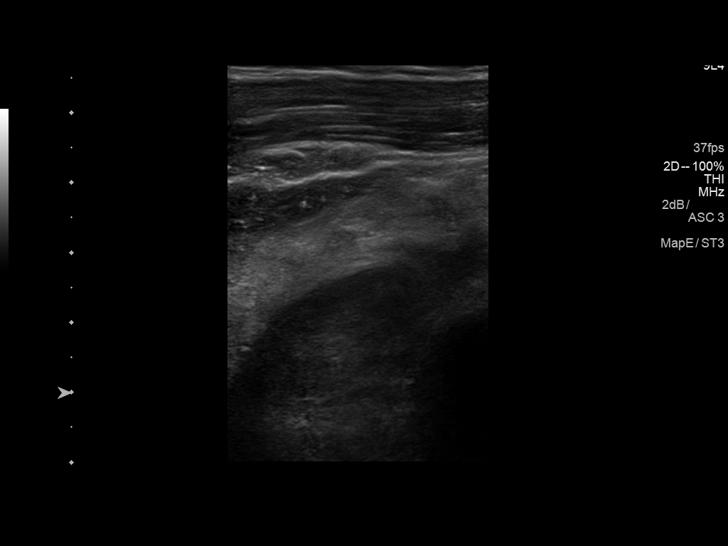
[im 14/54]
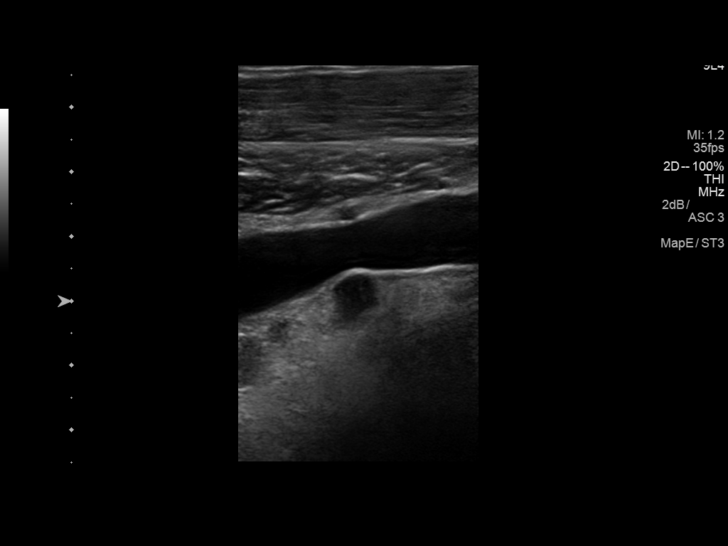
[im 19/54]
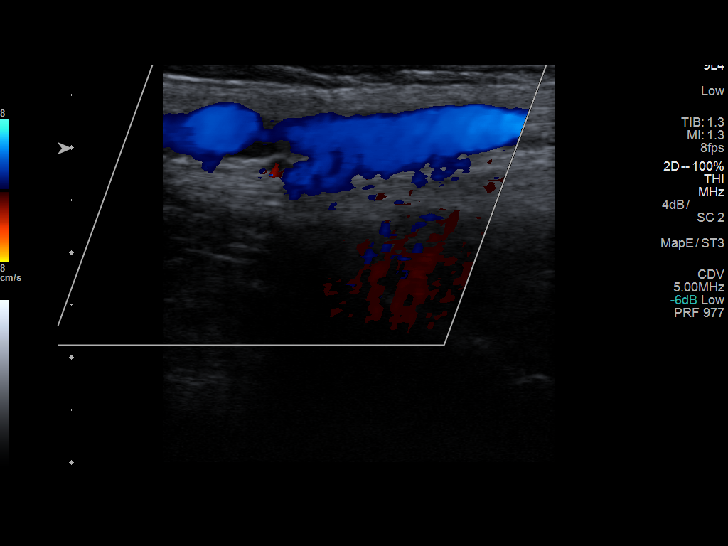
[im 24/54]
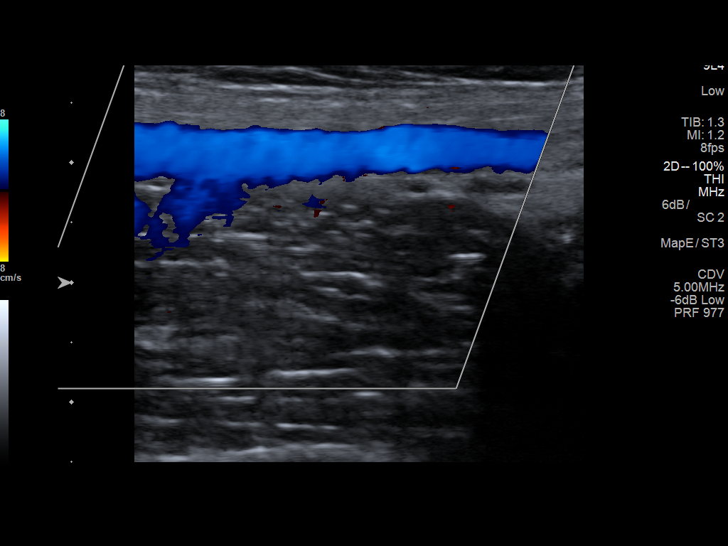
[im 28/54]
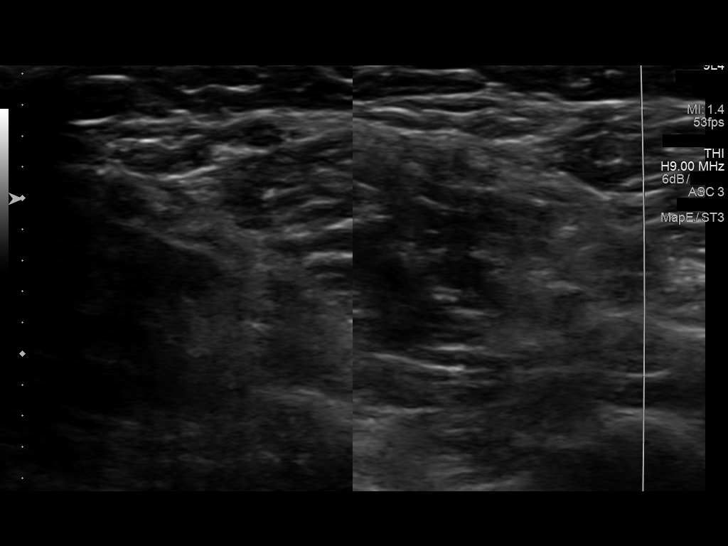
[im 30/54]
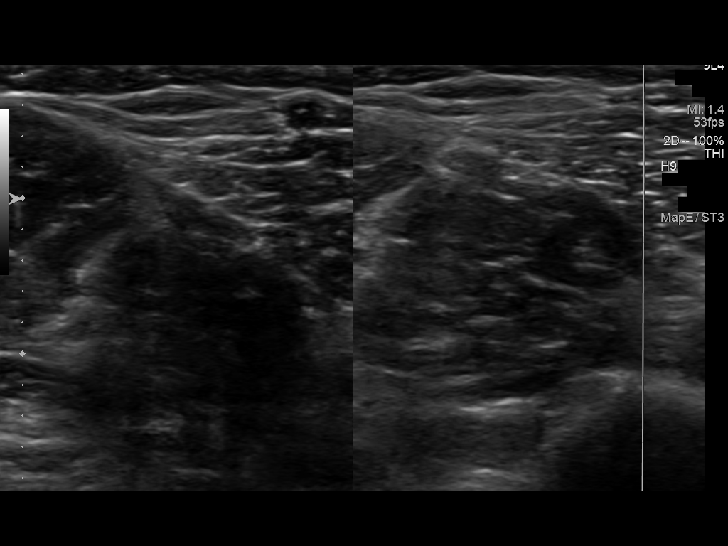
[im 35/54]
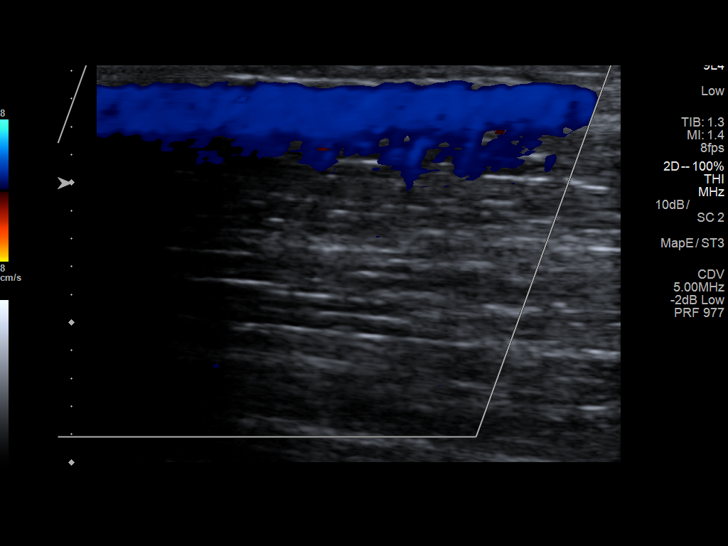
[im 40/54]
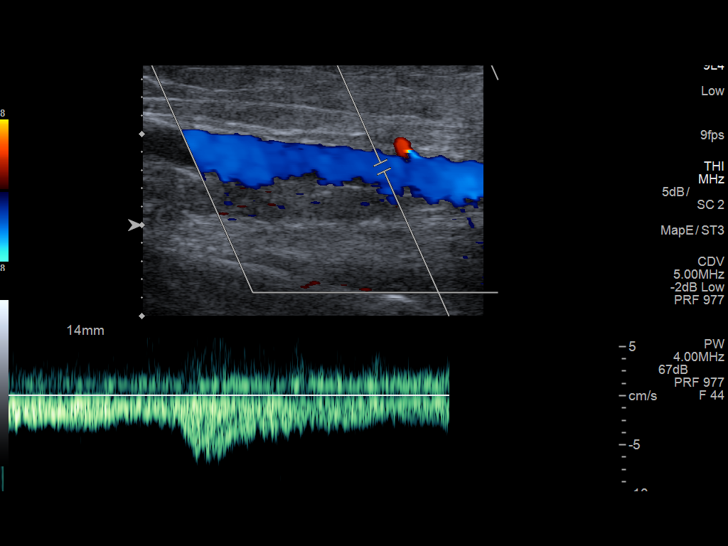
[im 44/54]
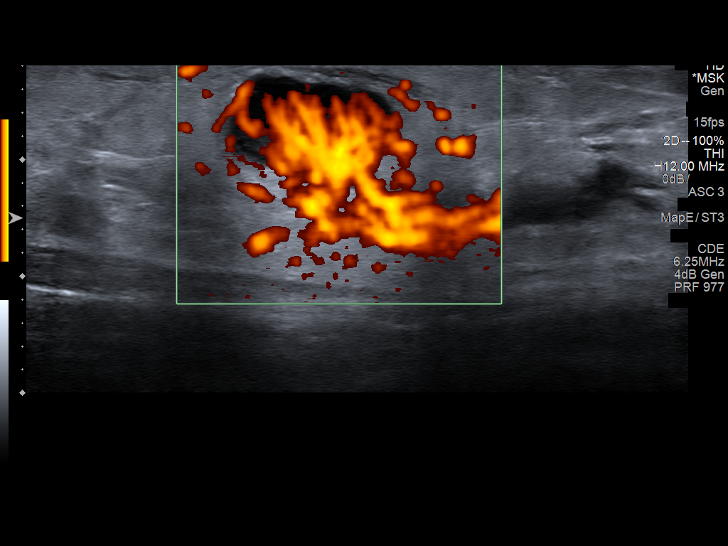
[im 49/54]
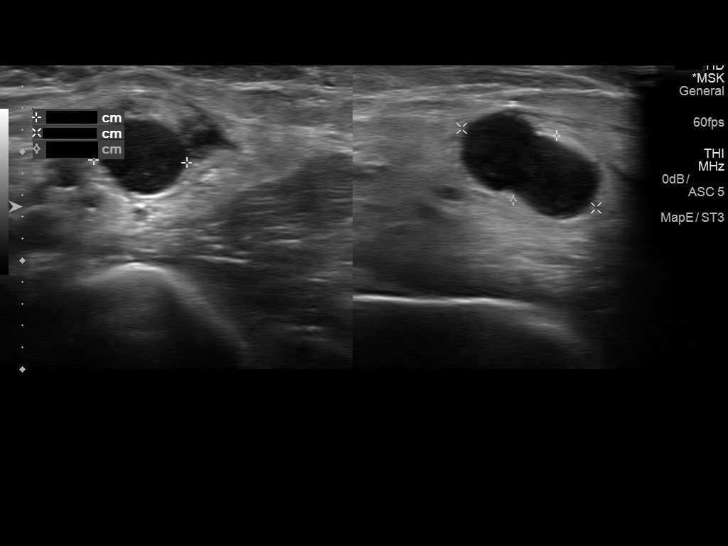
[im 54/54]
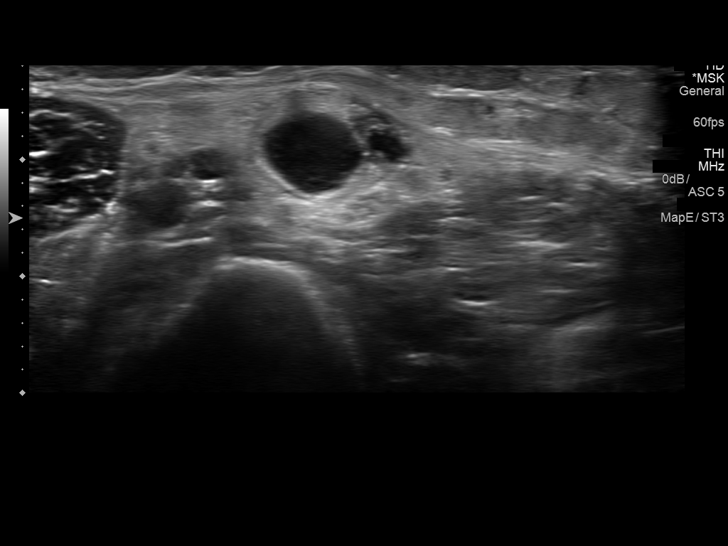

[13 of 24 positions shown; findings below may reference images not displayed]

FINDINGS: Contralateral Subclavian Vein: Respiratory phasicity is normal and
symmetric with the symptomatic side. No evidence of thrombus. Normal
compressibility.

Internal Jugular Vein: No evidence of thrombus. Normal
compressibility, respiratory phasicity and response to augmentation.

Subclavian Vein: No evidence of thrombus. Normal compressibility,
respiratory phasicity and response to augmentation.

Axillary Vein: No evidence of thrombus. Normal compressibility,
respiratory phasicity and response to augmentation.

Cephalic Vein: No evidence of thrombus. Normal compressibility,
respiratory phasicity and response to augmentation.

Basilic Vein: No evidence of thrombus. Normal compressibility,
respiratory phasicity and response to augmentation.

Brachial Veins: No evidence of thrombus. Normal compressibility,
respiratory phasicity and response to augmentation.

Radial Veins: No evidence of thrombus. Normal compressibility,
respiratory phasicity and response to augmentation.

Ulnar Veins: No evidence of thrombus. Normal compressibility,
respiratory phasicity and response to augmentation.

Venous Reflux:  None visualized.

Other Findings: In the upper right all arm correspond to palpable
abnormalities bar to lymph nodes which measure 1.9 and 1.4 cm in
greatest dimension respectively. These are likely reactive in
nature.
IMPRESSION: No evidence of deep venous thrombosis.

Likely reactive lymph nodes in the upper right arm.
